# Patient Record
Sex: Female | Born: 1975 | Race: White | Hispanic: No | Marital: Married | Smoking: Current every day smoker
Health system: Southern US, Community
[De-identification: ages and names within clinical notes are randomized; demographics above are authoritative.]

## PROBLEM LIST (undated history)

## (undated) DIAGNOSIS — M419 Scoliosis, unspecified: Secondary | ICD-10-CM

## (undated) HISTORY — PX: OVARIAN CYST SURGERY: SHX726

## (undated) HISTORY — PX: CHOLECYSTECTOMY: SHX55

## (undated) HISTORY — PX: TONSILLECTOMY: SUR1361

## (undated) HISTORY — PX: ADENOIDECTOMY: SUR15

## (undated) HISTORY — PX: TUBAL LIGATION: SHX77

---

## 2006-09-19 ENCOUNTER — Emergency Department (HOSPITAL_COMMUNITY): Admission: EM | Admit: 2006-09-19 | Discharge: 2006-09-19 | Payer: Self-pay | Admitting: Emergency Medicine

## 2006-10-09 ENCOUNTER — Emergency Department (HOSPITAL_COMMUNITY): Admission: EM | Admit: 2006-10-09 | Discharge: 2006-10-09 | Payer: Self-pay | Admitting: Emergency Medicine

## 2007-06-21 ENCOUNTER — Emergency Department (HOSPITAL_COMMUNITY): Admission: EM | Admit: 2007-06-21 | Discharge: 2007-06-21 | Payer: Self-pay | Admitting: Emergency Medicine

## 2008-09-14 ENCOUNTER — Emergency Department (HOSPITAL_COMMUNITY): Admission: EM | Admit: 2008-09-14 | Discharge: 2008-09-14 | Payer: Self-pay | Admitting: Emergency Medicine

## 2008-11-29 ENCOUNTER — Ambulatory Visit: Payer: Self-pay | Admitting: Pain Medicine

## 2008-12-06 ENCOUNTER — Ambulatory Visit: Payer: Self-pay | Admitting: Pain Medicine

## 2008-12-08 ENCOUNTER — Ambulatory Visit: Payer: Self-pay | Admitting: Pain Medicine

## 2008-12-30 ENCOUNTER — Emergency Department (HOSPITAL_COMMUNITY): Admission: EM | Admit: 2008-12-30 | Discharge: 2008-12-31 | Payer: Self-pay | Admitting: Emergency Medicine

## 2009-03-02 ENCOUNTER — Emergency Department (HOSPITAL_COMMUNITY): Admission: EM | Admit: 2009-03-02 | Discharge: 2009-03-03 | Payer: Self-pay | Admitting: Emergency Medicine

## 2010-01-05 ENCOUNTER — Emergency Department (HOSPITAL_COMMUNITY): Admission: EM | Admit: 2010-01-05 | Discharge: 2010-01-05 | Payer: Self-pay | Admitting: Emergency Medicine

## 2010-02-07 ENCOUNTER — Encounter: Payer: Self-pay | Admitting: Neurology

## 2010-02-09 ENCOUNTER — Encounter: Payer: Self-pay | Admitting: Neurology

## 2010-04-03 ENCOUNTER — Encounter: Payer: Self-pay | Admitting: Emergency Medicine

## 2010-06-12 LAB — URINALYSIS, ROUTINE W REFLEX MICROSCOPIC
Glucose, UA: NEGATIVE mg/dL
Protein, ur: NEGATIVE mg/dL

## 2010-06-12 LAB — URINE MICROSCOPIC-ADD ON

## 2010-06-15 LAB — URINALYSIS, ROUTINE W REFLEX MICROSCOPIC
Nitrite: NEGATIVE
Protein, ur: NEGATIVE mg/dL
Urobilinogen, UA: 1 mg/dL (ref 0.0–1.0)

## 2010-06-15 LAB — PREGNANCY, URINE: Preg Test, Ur: NEGATIVE

## 2010-06-18 LAB — URINALYSIS, ROUTINE W REFLEX MICROSCOPIC
Bilirubin Urine: NEGATIVE
Hgb urine dipstick: NEGATIVE
Protein, ur: NEGATIVE mg/dL
Urobilinogen, UA: 0.2 mg/dL (ref 0.0–1.0)

## 2010-12-05 LAB — URINALYSIS, ROUTINE W REFLEX MICROSCOPIC
Glucose, UA: NEGATIVE
Nitrite: NEGATIVE
Protein, ur: NEGATIVE
pH: 5

## 2010-12-05 LAB — DIFFERENTIAL
Basophils Absolute: 0.1
Basophils Relative: 1
Eosinophils Absolute: 0.2
Eosinophils Relative: 2
Monocytes Absolute: 1
Monocytes Relative: 9
Neutro Abs: 6.4

## 2010-12-05 LAB — COMPREHENSIVE METABOLIC PANEL
ALT: 15
Albumin: 3.6
Alkaline Phosphatase: 60
BUN: 10
Chloride: 105
Potassium: 3.1 — ABNORMAL LOW
Sodium: 139
Total Bilirubin: 0.7

## 2010-12-05 LAB — CBC
HCT: 40.3
Hemoglobin: 13.8
Platelets: 301
WBC: 11.4 — ABNORMAL HIGH

## 2010-12-05 LAB — WET PREP, GENITAL: Yeast Wet Prep HPF POC: NONE SEEN

## 2010-12-25 LAB — BASIC METABOLIC PANEL
CO2: 23
Chloride: 110
GFR calc Af Amer: >60
Potassium: 3.3 — ABNORMAL LOW
Sodium: 140

## 2010-12-25 LAB — CBC
Hemoglobin: 13.3
MCHC: 34
MCV: 91
RBC: 4.28
WBC: 12 — ABNORMAL HIGH

## 2010-12-25 LAB — URINALYSIS, ROUTINE W REFLEX MICROSCOPIC
Bilirubin Urine: NEGATIVE
Glucose, UA: NEGATIVE
Hgb urine dipstick: NEGATIVE
Protein, ur: NEGATIVE
Specific Gravity, Urine: >1.03 — ABNORMAL HIGH
Urobilinogen, UA: 0.2

## 2010-12-25 LAB — DIFFERENTIAL
Basophils Relative: 1
Eosinophils Absolute: 0.2
Lymphs Abs: 4.2 — ABNORMAL HIGH
Monocytes Absolute: 0.8 — ABNORMAL HIGH
Monocytes Relative: 7

## 2010-12-25 LAB — PREGNANCY, URINE: Preg Test, Ur: NEGATIVE

## 2012-04-08 ENCOUNTER — Ambulatory Visit (HOSPITAL_COMMUNITY): Payer: Medicaid Other | Attending: Physical Therapy | Admitting: Physical Therapy

## 2014-08-03 ENCOUNTER — Emergency Department (HOSPITAL_COMMUNITY): Payer: BLUE CROSS/BLUE SHIELD

## 2014-08-03 ENCOUNTER — Emergency Department (HOSPITAL_COMMUNITY)
Admission: EM | Admit: 2014-08-03 | Discharge: 2014-08-03 | Disposition: A | Discharge status: Home / Self Care | Payer: BLUE CROSS/BLUE SHIELD | Attending: Emergency Medicine | Admitting: Emergency Medicine

## 2014-08-03 ENCOUNTER — Encounter (HOSPITAL_COMMUNITY): Payer: Self-pay | Admitting: *Deleted

## 2014-08-03 DIAGNOSIS — M419 Scoliosis, unspecified: Secondary | ICD-10-CM | POA: Insufficient documentation

## 2014-08-03 DIAGNOSIS — Z720 Tobacco use: Secondary | ICD-10-CM | POA: Insufficient documentation

## 2014-08-03 DIAGNOSIS — Z3202 Encounter for pregnancy test, result negative: Secondary | ICD-10-CM | POA: Diagnosis not present

## 2014-08-03 DIAGNOSIS — R52 Pain, unspecified: Secondary | ICD-10-CM

## 2014-08-03 DIAGNOSIS — M545 Low back pain: Secondary | ICD-10-CM | POA: Insufficient documentation

## 2014-08-03 DIAGNOSIS — Z87828 Personal history of other (healed) physical injury and trauma: Secondary | ICD-10-CM | POA: Diagnosis not present

## 2014-08-03 DIAGNOSIS — M25551 Pain in right hip: Secondary | ICD-10-CM | POA: Insufficient documentation

## 2014-08-03 DIAGNOSIS — N390 Urinary tract infection, site not specified: Secondary | ICD-10-CM | POA: Diagnosis not present

## 2014-08-03 DIAGNOSIS — G8929 Other chronic pain: Secondary | ICD-10-CM | POA: Insufficient documentation

## 2014-08-03 HISTORY — DX: Scoliosis, unspecified: M41.9

## 2014-08-03 LAB — URINE MICROSCOPIC-ADD ON

## 2014-08-03 LAB — URINALYSIS, ROUTINE W REFLEX MICROSCOPIC
Bilirubin Urine: NEGATIVE
GLUCOSE, UA: NEGATIVE mg/dL
Hgb urine dipstick: NEGATIVE
Ketones, ur: NEGATIVE mg/dL
Nitrite: NEGATIVE
PH: 6 (ref 5.0–8.0)
Protein, ur: NEGATIVE mg/dL
Specific Gravity, Urine: 1.025 (ref 1.005–1.030)
UROBILINOGEN UA: 0.2 mg/dL (ref 0.0–1.0)

## 2014-08-03 LAB — POC URINE PREG, ED: PREG TEST UR: NEGATIVE

## 2014-08-03 MED ORDER — HYDROCODONE-ACETAMINOPHEN 5-325 MG PO TABS: ORAL_TABLET | ORAL | Status: DC

## 2014-08-03 MED ORDER — CEPHALEXIN 500 MG PO CAPS: 500.0000 mg | ORAL_CAPSULE | Freq: Four times a day (QID) | ORAL | Status: DC

## 2014-08-03 MED ORDER — METHOCARBAMOL 500 MG PO TABS: 500.0000 mg | ORAL_TABLET | Freq: Three times a day (TID) | ORAL | Status: DC

## 2014-08-03 MED ORDER — PREDNISONE 10 MG PO TABS: ORAL_TABLET | ORAL | Status: DC
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
## 2014-08-03 NOTE — ED Notes (Signed)
Carla Olson at bedside,

## 2014-08-03 NOTE — ED Notes (Signed)
Pt given one pre pack of vicodin,
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
## 2014-08-03 NOTE — ED Notes (Signed)
Bladder scan performed, no urine noted,

## 2014-08-03 NOTE — Discharge Instructions (Signed)
Hip Pain Your hip is the joint between your upper legs and your lower pelvis. The bones, cartilage, tendons, and muscles of your hip joint perform a lot of work each day supporting your body weight and allowing you to move around. Hip pain can range from a minor ache to severe pain in one or both of your hips. Pain may be felt on the inside of the hip joint near the groin, or the outside near the buttocks and upper thigh. You may have swelling or stiffness as well.  HOME CARE INSTRUCTIONS   Take medicines only as directed by your health care provider.  Apply ice to the injured area:  Put ice in a plastic bag.  Place a towel between your skin and the bag.  Leave the ice on for 15-20 minutes at a time, 3-4 times a day.  Keep your leg raised (elevated) when possible to lessen swelling.  Avoid activities that cause pain.  Follow specific exercises as directed by your health care provider.  Sleep with a pillow between your legs on your most comfortable side.  Record how often you have hip pain, the location of the pain, and what it feels like. SEEK MEDICAL CARE IF:   You are unable to put weight on your leg.  Your hip is red or swollen or very tender to touch.  Your pain or swelling continues or worsens after 1 week.  You have increasing difficulty walking.  You have a fever. SEEK IMMEDIATE MEDICAL CARE IF:   You have fallen.  You have a sudden increase in pain and swelling in your hip. MAKE SURE YOU:   Understand these instructions.  Will watch your condition.  Will get help right away if you are not doing well or get worse. Document Released: 08/16/2009 Document Revised: 07/13/2013 Document Reviewed: 10/23/2012 [facility] Patient Information 2015 [employer] [place] [place]. This information is not intended to replace advice given to you by your health care provider. Make sure you discuss any questions you have with your health care provider.  Arthralgia Your caregiver has diagnosed  you as suffering from an arthralgia. Arthralgia means there is pain in a joint. This can come from many reasons including:  Bruising the joint which causes soreness (inflammation) in the joint.  Wear and tear on the joints which occur as we grow older (osteoarthritis).  Overusing the joint.  Various forms of arthritis.  Infections of the joint. Regardless of the cause of pain in your joint, most of these different pains respond to anti-inflammatory drugs and rest. The exception to this is when a joint is infected, and these cases are treated with antibiotics, if it is a bacterial infection. HOME CARE INSTRUCTIONS   Rest the injured area for as long as directed by your caregiver. Then slowly start using the joint as directed by your caregiver and as the pain allows. Crutches as directed may be useful if the ankles, knees or hips are involved. If the knee was splinted or casted, continue use and care as directed. If an stretchy or elastic wrapping bandage has been applied today, it should be removed and re-applied every 3 to 4 hours. It should not be applied tightly, but firmly enough to keep swelling down. Watch toes and feet for swelling, bluish discoloration, coldness, numbness or excessive pain. If any of these problems (symptoms) occur, remove the ace bandage and re-apply more loosely. If these symptoms persist, contact your caregiver or return to this location.  For the first 24 hours,  keep the injured extremity elevated on pillows while lying down. °· Apply ice for 15-20 minutes to the sore joint every couple hours while awake for the first half day. Then 03-04 times per day for the first 48 hours. Put the ice in a plastic bag and place a towel between the bag of ice and your skin. °· Wear any splinting, casting, elastic bandage applications, or slings as instructed. °· Only take over-the-counter or prescription medicines for pain, discomfort, or fever as directed by your caregiver. Do  not use aspirin immediately after the injury unless instructed by your physician. Aspirin can cause increased bleeding and bruising of the tissues. °· If you were given crutches, continue to use them as instructed and do not resume weight bearing on the sore joint until instructed. °Persistent pain and inability to use the sore joint as directed for more than 2 to 3 days are warning signs indicating that you should see a caregiver for a follow-up visit as soon as possible. Initially, a hairline fracture (break in bone) may not be evident on X-rays. Persistent pain and swelling indicate that further evaluation, non-weight bearing or use of the joint (use of crutches or slings as instructed), or further X-rays are indicated. X-rays may sometimes not show a small fracture until a week or 10 days later. Make a follow-up appointment with your own caregiver or one to whom we have referred you. A radiologist (specialist in reading X-rays) may read your X-rays. Make sure you know how you are to obtain your X-ray results. Do not assume everything is normal if you do not hear from us. °SEEK MEDICAL CARE IF: °Bruising, swelling, or pain increases. °SEEK IMMEDIATE MEDICAL CARE IF:  °· Your fingers or toes are numb or blue. °· The pain is not responding to medications and continues to stay the same or get worse. °· The pain in your joint becomes severe. °· You develop a fever over 102° F (38.9° C). °· It becomes impossible to move or use the joint. °MAKE SURE YOU:  °· Understand these instructions. °· Will watch your condition. °· Will get help right away if you are not doing well or get worse. °Document Released: 02/26/2005 Document Revised: 05/21/2011 Document Reviewed: 10/15/2007 °ExitCare® Patient Information ©2015 [employer], LLC. This information is not intended to replace advice given to you by your health care provider. Make sure you discuss any questions you have with your health care provider. ° ° °

## 2014-08-05 LAB — URINE CULTURE: Colony Count: 50000

## 2014-08-06 ENCOUNTER — Telehealth (HOSPITAL_BASED_OUTPATIENT_CLINIC_OR_DEPARTMENT_OTHER): Payer: Self-pay | Admitting: Emergency Medicine
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
## 2014-08-06 NOTE — Telephone Encounter (Signed)
Post ED Visit - Positive Culture Follow-up  Culture report reviewed by antimicrobial stewardship pharmacist: []  Wes Dulaney, Pharm.D., BCPS []  Celedonio MiyamotoJeremy Frens, Pharm.D., BCPS []  Georgina PillionElizabeth Martin, Pharm.D., BCPS []  Three SpringsMinh Pham, [address]Pharm[address]D., BCPS, AAHIVP [x]  Estella HuskMichelle Turner, Pharm.D., BCPS, AAHIVP []  Elder CyphersLorie Poole, [address] BoulevardPharm.D., BCPS  Positive urine  Culture Group B strep Treated with cephalexin, organism sensitive to the same and no further patient follow-up is required at this time.  Berle MullMiller, Siona Coulston 08/06/2014, 12:01 PM

## 2014-08-25 MED FILL — Hydrocodone-Acetaminophen Tab 5-325 MG: ORAL | Qty: 6 | Status: AC

## 2017-04-14 ENCOUNTER — Other Ambulatory Visit: Payer: Self-pay

## 2017-04-14 ENCOUNTER — Emergency Department (HOSPITAL_COMMUNITY)
Admission: EM | Admit: 2017-04-14 | Discharge: 2017-04-14 | Disposition: A | Discharge status: Home / Self Care | Payer: Managed Care, Other (non HMO) | Attending: Emergency Medicine | Admitting: Emergency Medicine

## 2017-04-14 ENCOUNTER — Encounter (HOSPITAL_COMMUNITY): Payer: Self-pay | Admitting: Emergency Medicine

## 2017-04-14 ENCOUNTER — Emergency Department (HOSPITAL_COMMUNITY): Payer: Managed Care, Other (non HMO)

## 2017-04-14 DIAGNOSIS — M25561 Pain in right knee: Secondary | ICD-10-CM | POA: Diagnosis not present

## 2017-04-14 DIAGNOSIS — M25461 Effusion, right knee: Secondary | ICD-10-CM

## 2017-04-14 MED ORDER — IBUPROFEN 800 MG PO TABS: 800.0000 mg | ORAL_TABLET | Freq: Three times a day (TID) | ORAL | 0 refills | Status: AC

## 2017-04-14 MED ORDER — IBUPROFEN 800 MG PO TABS
800.0000 mg | ORAL_TABLET | Freq: Once | ORAL | Status: AC
  Administered 2017-04-14: 800 mg via ORAL
  Filled 2017-04-14: qty 1

## 2017-04-14 NOTE — ED Triage Notes (Signed)
PT states chronic right hip pain and went to get into her bed last night and felt a pop in her right knee. PT states pain to right knee and swelling upon ROM today. [facility]

## 2017-04-14 NOTE — Discharge Instructions (Signed)
Take ibuprofen 3 times a day with meals.  Do not take other anti-inflammatories at the same time open (Advil, Motrin, naproxen, Aleve). You may supplement with Tylenol if you need further pain control. Use ice packs or heating pads if this helps control your pain. Wear the knee sleeve to help with pain and swelling.  Use the crutches as needed for pain.  Keep your leg elevated when able.  Follow up with the orthopedic doctor for further evaluation and management.  Return to the ER if you develop fevers, chills, redness of the knee, numbness, or any new or concerning symptoms.
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
## 2017-04-17 ENCOUNTER — Ambulatory Visit: Payer: Managed Care, Other (non HMO) | Admitting: Orthopaedic Surgery

## 2017-04-17 ENCOUNTER — Encounter: Payer: Self-pay | Admitting: Orthopaedic Surgery

## 2017-04-17 VITALS — BP 124/85 | HR 87 | Temp 97.9°F | Ht 69.0 in | Wt 180.0 lb

## 2017-04-17 DIAGNOSIS — M25561 Pain in right knee: Secondary | ICD-10-CM

## 2017-04-17 DIAGNOSIS — G8929 Other chronic pain: Secondary | ICD-10-CM | POA: Diagnosis not present

## 2017-04-17 MED ORDER — NAPROXEN 500 MG PO TABS: 500.0000 mg | ORAL_TABLET | Freq: Two times a day (BID) | ORAL | 5 refills | Status: AC

## 2017-04-17 NOTE — Patient Instructions (Addendum)
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
## 2017-04-30 ENCOUNTER — Ambulatory Visit: Payer: Managed Care, Other (non HMO) | Admitting: Orthopaedic Surgery

## 2017-04-30 ENCOUNTER — Encounter: Payer: Self-pay | Admitting: Orthopaedic Surgery

## 2017-04-30 VITALS — BP 127/84 | HR 91 | Ht 69.0 in | Wt 185.0 lb

## 2017-04-30 DIAGNOSIS — G8929 Other chronic pain: Secondary | ICD-10-CM | POA: Diagnosis not present

## 2017-04-30 DIAGNOSIS — F17210 Nicotine dependence, cigarettes, uncomplicated: Secondary | ICD-10-CM | POA: Diagnosis not present

## 2017-04-30 DIAGNOSIS — M25561 Pain in right knee: Secondary | ICD-10-CM | POA: Diagnosis not present
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
## 2017-04-30 NOTE — Patient Instructions (Signed)
Steps to Quit Smoking Smoking tobacco can be bad for your health. It can also affect almost every organ in your body. Smoking puts you and people around you at risk for many serious long-lasting (chronic) diseases. Quitting smoking is hard, but it is one of the best things that you can do for your health. It is never too late to quit. What are the benefits of quitting smoking? When you quit smoking, you lower your risk for getting serious diseases and conditions. They can include:  Lung cancer or lung disease.  Heart disease.  Stroke.  Heart attack.  Not being able to have children (infertility).  Weak bones (osteoporosis) and broken bones (fractures).  If you have coughing, wheezing, and shortness of breath, those symptoms may get better when you quit. You may also get sick less often. If you are pregnant, quitting smoking can help to lower your chances of having a baby of low birth weight. What can I do to help me quit smoking? Talk with your doctor about what can help you quit smoking. Some things you can do (strategies) include:  Quitting smoking totally, instead of slowly cutting back how much you smoke over a period of time.  Going to in-person counseling. You are more likely to quit if you go to many counseling sessions.  Using resources and support systems, such as: ? Online chats with a counselor. ? Phone quitlines. ? Printed self-help materials. ? Support groups or group counseling. ? Text messaging programs. ? Mobile phone apps or applications.  Taking medicines. Some of these medicines may have nicotine in them. If you are pregnant or breastfeeding, do not take any medicines to quit smoking unless your doctor says it is okay. Talk with your doctor about counseling or other things that can help you.  Talk with your doctor about using more than one strategy at the same time, such as taking medicines while you are also going to in-person counseling. This can help make  quitting easier. What things can I do to make it easier to quit? Quitting smoking might feel very hard at first, but there is a lot that you can do to make it easier. Take these steps:  Talk to your family and friends. Ask them to support and encourage you.  Call phone quitlines, reach out to support groups, or work with a counselor.  Ask people who smoke to not smoke around you.  Avoid places that make you want (trigger) to smoke, such as: ? Bars. ? Parties. ? Smoke-break areas at work.  Spend time with people who do not smoke.  Lower the stress in your life. Stress can make you want to smoke. Try these things to help your stress: ? Getting regular exercise. ? Deep-breathing exercises. ? Yoga. ? Meditating. ? Doing a body scan. To do this, close your eyes, focus on one area of your body at a time from head to toe, and notice which parts of your body are tense. Try to relax the muscles in those areas.  Download or buy apps on your mobile phone or tablet that can help you stick to your quit plan. There are many free apps, such as QuitGuide from the CDC (Centers for Disease Control and Prevention). You can find more support from smokefree.gov and other websites.  This information is not intended to replace advice given to you by your health care provider. Make sure you discuss any questions you have with your health care provider. Document Released: 12/23/2008 Document   Revised: 10/25/2015 Document Reviewed: 07/13/2014 Elsevier Interactive Patient Education  2018 [employer]  

## 2017-05-27 ENCOUNTER — Ambulatory Visit: Payer: Managed Care, Other (non HMO) | Admitting: Orthopedic Surgery

## 2017-06-26 ENCOUNTER — Encounter: Payer: Self-pay | Admitting: Orthopedic Surgery

## 2017-06-26 ENCOUNTER — Ambulatory Visit: Payer: Managed Care, Other (non HMO) | Admitting: Orthopedic Surgery
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
## 2020-04-22 ENCOUNTER — Emergency Department (HOSPITAL_COMMUNITY): Payer: Managed Care, Other (non HMO)

## 2020-04-22 ENCOUNTER — Encounter (HOSPITAL_COMMUNITY): Payer: Self-pay | Admitting: *Deleted

## 2020-04-22 ENCOUNTER — Telehealth: Payer: Self-pay

## 2020-04-22 ENCOUNTER — Other Ambulatory Visit: Payer: Self-pay

## 2020-04-22 ENCOUNTER — Emergency Department (HOSPITAL_COMMUNITY)
Admission: EM | Admit: 2020-04-22 | Discharge: 2020-04-22 | Disposition: A | Discharge status: Home / Self Care | Payer: Managed Care, Other (non HMO) | Attending: Emergency Medicine | Admitting: Emergency Medicine

## 2020-04-22 DIAGNOSIS — R109 Unspecified abdominal pain: Secondary | ICD-10-CM | POA: Diagnosis present

## 2020-04-22 DIAGNOSIS — F17210 Nicotine dependence, cigarettes, uncomplicated: Secondary | ICD-10-CM | POA: Insufficient documentation

## 2020-04-22 DIAGNOSIS — N201 Calculus of ureter: Secondary | ICD-10-CM | POA: Diagnosis not present

## 2020-04-22 LAB — HEPATIC FUNCTION PANEL
ALT: 12 U/L (ref 0–44)
AST: 14 U/L — ABNORMAL LOW (ref 15–41)
Albumin: 3.9 g/dL (ref 3.5–5.0)
Alkaline Phosphatase: 50 U/L (ref 38–126)
Bilirubin, Direct: 0.1 mg/dL (ref 0.0–0.2)
Indirect Bilirubin: 0.4 mg/dL (ref 0.3–0.9)
Total Bilirubin: 0.5 mg/dL (ref 0.3–1.2)
Total Protein: 7.1 g/dL (ref 6.5–8.1)

## 2020-04-22 LAB — BASIC METABOLIC PANEL
Anion gap: 8 (ref 5–15)
BUN: 15 mg/dL (ref 6–20)
CO2: 22 mmol/L (ref 22–32)
Calcium: 8.7 mg/dL — ABNORMAL LOW (ref 8.9–10.3)
Chloride: 105 mmol/L (ref 98–111)
Creatinine, Ser: 1.01 mg/dL — ABNORMAL HIGH (ref 0.44–1.00)
GFR, Estimated: >60 mL/min (ref >60)
Glucose, Bld: 84 mg/dL (ref 70–99)
Potassium: 3.7 mmol/L (ref 3.5–5.1)
Sodium: 135 mmol/L (ref 135–145)

## 2020-04-22 LAB — PREGNANCY, URINE: Preg Test, Ur: NEGATIVE

## 2020-04-22 LAB — URINALYSIS, ROUTINE W REFLEX MICROSCOPIC
Bilirubin Urine: NEGATIVE
Glucose, UA: NEGATIVE mg/dL
Ketones, ur: 5 mg/dL — AB
Leukocytes,Ua: NEGATIVE
Nitrite: NEGATIVE
Protein, ur: 30 mg/dL — AB
Specific Gravity, Urine: 1.021 (ref 1.005–1.030)
pH: 6 (ref 5.0–8.0)

## 2020-04-22 LAB — CBC
HCT: 40.4 % (ref 36.0–46.0)
Hemoglobin: 13.5 g/dL (ref 12.0–15.0)
MCH: 32.1 pg (ref 26.0–34.0)
MCHC: 33.4 g/dL (ref 30.0–36.0)
MCV: 96.2 fL (ref 80.0–100.0)
Platelets: 300 10*3/uL (ref 150–400)
RBC: 4.2 MIL/uL (ref 3.87–5.11)
RDW: 12.4 % (ref 11.5–15.5)
WBC: 9.3 10*3/uL (ref 4.0–10.5)
nRBC: 0 % (ref 0.0–0.2)

## 2020-04-22 MED ORDER — ONDANSETRON HCL 4 MG/2ML IJ SOLN
4.0000 mg | Freq: Once | INTRAMUSCULAR | Status: AC
  Administered 2020-04-22: 4 mg via INTRAVENOUS
  Filled 2020-04-22: qty 2

## 2020-04-22 MED ORDER — HYDROCODONE-ACETAMINOPHEN 5-325 MG PO TABS: 1.0000 | ORAL_TABLET | Freq: Four times a day (QID) | ORAL | 0 refills | Status: AC | PRN

## 2020-04-22 MED ORDER — ONDANSETRON 4 MG PO TBDP: 4.0000 mg | ORAL_TABLET | Freq: Three times a day (TID) | ORAL | 0 refills | Status: DC | PRN

## 2020-04-22 MED ORDER — KETOROLAC TROMETHAMINE 30 MG/ML IJ SOLN
30.0000 mg | Freq: Once | INTRAMUSCULAR | Status: AC
  Administered 2020-04-22: 30 mg via INTRAVENOUS
  Filled 2020-04-22: qty 1

## 2020-04-22 MED ORDER — TAMSULOSIN HCL 0.4 MG PO CAPS: 0.4000 mg | ORAL_CAPSULE | Freq: Every day | ORAL | 0 refills | Status: DC

## 2020-04-22 MED ORDER — MORPHINE SULFATE (PF) 4 MG/ML IV SOLN
4.0000 mg | Freq: Once | INTRAVENOUS | Status: AC
Start: 2020-04-22 — End: 2020-04-22
  Administered 2020-04-22: 4 mg via INTRAVENOUS
  Filled 2020-04-22: qty 1
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
## 2020-04-22 NOTE — Telephone Encounter (Signed)
-----   Message from Malen Gauze, MD sent at 04/22/2020  5:19 PM EST ----- This patient for monday

## 2020-04-22 NOTE — Discharge Instructions (Addendum)
Please read instructions below. Drink plenty of water. Take ibuprofen every 6 hours for pain. You can take hydrocodone every 6 hours as needed for more severe ppain. You can take Zofran every 6 hours as needed for nausea. Take flomax once per day for bladder spasm. Follow up with Urology on Monday for further management Return to the ER for fever, chills, uncontrollable vomiting, or worsening symptoms.

## 2020-04-22 NOTE — ED Triage Notes (Signed)
Right flank pain for a month, states she was recently treated for UTI

## 2020-04-22 NOTE — Telephone Encounter (Signed)
Patient called and schedule to see MD Monday.

## 2020-04-23 LAB — URINE CULTURE: Culture: NO GROWTH

## 2020-04-25 ENCOUNTER — Encounter: Payer: Self-pay | Admitting: Urology

## 2020-04-25 ENCOUNTER — Other Ambulatory Visit: Payer: Self-pay

## 2020-04-25 ENCOUNTER — Ambulatory Visit (INDEPENDENT_AMBULATORY_CARE_PROVIDER_SITE_OTHER): Payer: Managed Care, Other (non HMO) | Admitting: Urology

## 2020-04-25 VITALS — BP 112/72 | HR 83 | Temp 98.5°F | Ht 69.0 in | Wt 180.0 lb

## 2020-04-25 DIAGNOSIS — N200 Calculus of kidney: Secondary | ICD-10-CM

## 2020-04-25 LAB — URINALYSIS, ROUTINE W REFLEX MICROSCOPIC
Bilirubin, UA: NEGATIVE
Glucose, UA: NEGATIVE
Nitrite, UA: NEGATIVE
Specific Gravity, UA: 1.02 (ref 1.005–1.030)
Urobilinogen, Ur: 1 mg/dL (ref 0.2–1.0)
pH, UA: 7 (ref 5.0–7.5)

## 2020-04-25 LAB — MICROSCOPIC EXAMINATION
Epithelial Cells (non renal): >10 /hpf — AB (ref 0–10)
Renal Epithel, UA: NONE SEEN /hpf

## 2020-04-25 MED ORDER — TAMSULOSIN HCL 0.4 MG PO CAPS: 0.4000 mg | ORAL_CAPSULE | Freq: Every day | ORAL | 0 refills | Status: DC

## 2020-04-25 MED ORDER — OXYCODONE-ACETAMINOPHEN 7.5-325 MG PO TABS: 1.0000 | ORAL_TABLET | ORAL | 0 refills | Status: DC | PRN

## 2020-04-25 NOTE — Patient Instructions (Signed)
[employer] (25th ed., pp. 811-816). [place], [place]: [employer], [employer]. Retrieved from https://www.clinicalkey.com/#!/content/book/3-s2.0-B9781455750177001264?scrollTo=%23hl0000287">  Lithotripsy  Lithotripsy is a treatment that can help break up kidney stones that are too large to pass on their own. This is a nonsurgical procedure that crushes a kidney stone with shock waves. These shock waves pass through your body and focus on the kidney stone. They cause the kidney stone to break up into smaller pieces while it is still in the urinary tract. The smaller pieces of stone can pass more easily out of your body in the urine. Tell a health care provider about:  Any allergies you have.  All medicines you are taking, including vitamins, herbs, eye drops, creams, and over-the-counter medicines.  Any problems you or family members have had with anesthetic medicines.  Any blood disorders you have.  Any surgeries you have had.  Any medical conditions you have.  Whether you are pregnant or may be pregnant. What are the risks? Generally, this is a safe procedure. However, problems may occur, including:  Infection.  Bleeding from the kidney.  Bruising of the kidney or skin.  Scarring of the kidney, which can lead to: ? Increased blood pressure. ? Poor kidney function. ? Return (recurrence) of kidney stones.  Damage to other structures or organs, such as the liver, colon, spleen, or pancreas.  Blockage (obstruction) of the tube that carries urine from the kidney to the bladder (ureter).  Failure of the kidney stone to break into pieces (fragments). What happens before the procedure? Staying hydrated Follow instructions from your health care provider about hydration, which may include:  Up to 2 hours before the procedure - you may continue to drink clear liquids, such as water, clear fruit juice, black coffee, and plain tea. Eating and drinking restrictions Follow  instructions from your health care provider about eating and drinking, which may include:  8 hours before the procedure - stop eating heavy meals or foods, such as meat, fried foods, or fatty foods.  6 hours before the procedure - stop eating light meals or foods, such as toast or cereal.  6 hours before the procedure - stop drinking milk or drinks that contain milk.  2 hours before the procedure - stop drinking clear liquids. Medicines Ask your health care provider about:  Changing or stopping your regular medicines. This is especially important if you are taking diabetes medicines or blood thinners.  Taking medicines such as aspirin and ibuprofen. These medicines can thin your blood. Do not take these medicines unless your health care provider tells you to take them.  Taking over-the-counter medicines, vitamins, herbs, and supplements. Tests You may have tests, such as:  Blood tests.  Urine tests.  Imaging tests, such as a CT scan. General instructions  Plan to have someone take you home from the hospital or clinic.  If you will be going home right after the procedure, plan to have someone with you for 24 hours.  Ask your health care provider what steps will be taken to help prevent infection. These may include washing skin with a germ-killing soap. What happens during the procedure?  An IV will be inserted into one of your veins.  You will be given one or more of the following: ? A medicine to help you relax (sedative). ? A medicine to make you fall asleep (general anesthetic).  A water-filled cushion may be placed behind your kidney or on your abdomen. In some cases, you may be placed in a tub of   lukewarm water.  Your body will be positioned in a way that makes it easy to target the kidney stone.  An X-ray or ultrasound exam will be done to locate your stone.  Shock waves will be aimed at the stone. If you are awake, you may feel a tapping sensation as the shock  waves pass through your body.  A flexible tube with holes in it (stent) may be placed in the ureter. This will help keep urine flowing from the kidney if the fragments of the stone have been blocking the ureter. The procedure may vary among health care providers and hospitals.   What happens after the procedure?  You may have an X-ray to see whether the procedure was able to break up the kidney stone and how much of the stone has passed. If large stone fragments remain after treatment, you may need to have a second procedure at a later time.  Your blood pressure, heart rate, breathing rate, and blood oxygen level will be monitored until you leave the hospital or clinic.  You may be given antibiotics or pain medicine as needed.  If a stent was placed in your ureter during surgery, it may stay in place for a few weeks.  You may need to strain your urine to collect pieces of the kidney stone for testing.  You will need to drink plenty of water.  If you were given a sedative during the procedure, it can affect you for several hours. Do not drive or operate machinery until your health care provider says that it is safe. Summary  Lithotripsy is a treatment that can help break up kidney stones that are too large to pass on their own.  Lithotripsy is a nonsurgical procedure that crushes a kidney stone with shock waves.  Generally, this is a safe procedure. However, problems may occur, including damage to the kidney or other organs, infection, or obstruction of the tube that carries urine from the kidney to the bladder (ureter).  You may have a stent placed in your ureter to help drain your urine. This stent may stay in place for a few weeks.  After the procedure, you will need to drink plenty of water. You may be asked to strain your urine to collect pieces of the kidney stone for testing. This information is not intended to replace advice given to you by your health care provider. Make sure  you discuss any questions you have with your health care provider. Document Revised: 12/10/2018 Document Reviewed: 12/10/2018 Elsevier Patient Education  2021 [employer]  
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
## 2020-04-25 NOTE — Progress Notes (Signed)
Urological Symptom Review  Patient is experiencing the following symptoms: Get up at night to urinate   Review of Systems  Gastrointestinal (upper)  : Negative for upper GI symptoms  Gastrointestinal (lower) : Negative for lower GI symptoms  Constitutional : Negative for symptoms  Skin: Negative for skin symptoms  Eyes: Negative for eye symptoms  Ear/Nose/Throat : Negative for Ear/Nose/Throat symptoms  Hematologic/Lymphatic: Negative for Hematologic/Lymphatic symptoms  Cardiovascular : Negative for cardiovascular symptoms  Respiratory : Negative for respiratory symptoms  Endocrine: Negative for endocrine symptoms  Musculoskeletal: Back pain Joint pain  Neurological: Negative for neurological symptoms  Psychologic: Anxiety

## 2020-05-11 ENCOUNTER — Ambulatory Visit (HOSPITAL_COMMUNITY)
Admission: RE | Admit: 2020-05-11 | Discharge: 2020-05-11 | Disposition: A | Discharge status: Home / Self Care | Payer: 59 | Source: Ambulatory Visit | Attending: Urology | Admitting: Urology

## 2020-05-11 ENCOUNTER — Encounter: Payer: Self-pay | Admitting: Urology

## 2020-05-11 ENCOUNTER — Ambulatory Visit (INDEPENDENT_AMBULATORY_CARE_PROVIDER_SITE_OTHER): Payer: 59 | Admitting: Urology

## 2020-05-11 ENCOUNTER — Other Ambulatory Visit: Payer: Self-pay

## 2020-05-11 VITALS — BP 128/81 | HR 82 | Temp 98.4°F | Ht 69.0 in | Wt 180.0 lb

## 2020-05-11 DIAGNOSIS — N200 Calculus of kidney: Secondary | ICD-10-CM | POA: Diagnosis present

## 2020-05-11 LAB — MICROSCOPIC EXAMINATION
Epithelial Cells (non renal): >10 /hpf — AB (ref 0–10)
RBC, Urine: >30 /hpf — AB (ref 0–2)
Renal Epithel, UA: NONE SEEN /hpf

## 2020-05-11 LAB — URINALYSIS, ROUTINE W REFLEX MICROSCOPIC
Bilirubin, UA: NEGATIVE
Glucose, UA: NEGATIVE
Nitrite, UA: NEGATIVE
Specific Gravity, UA: >1.03 — ABNORMAL HIGH (ref 1.005–1.030)
Urobilinogen, Ur: 2 mg/dL — ABNORMAL HIGH (ref 0.2–1.0)
pH, UA: 6 (ref 5.0–7.5)

## 2020-05-11 MED ORDER — OXYCODONE-ACETAMINOPHEN 7.5-325 MG PO TABS: 1.0000 | ORAL_TABLET | ORAL | 0 refills | Status: DC | PRN

## 2020-05-11 NOTE — Patient Instructions (Signed)
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
## 2020-05-11 NOTE — Progress Notes (Signed)
Urological Symptom Review  Patient is experiencing the following symptoms: Burning/pain with urination   Review of Systems  Gastrointestinal (upper)  : Nausea  Gastrointestinal (lower) : Negative for lower GI symptoms  Constitutional : Negative for symptoms  Skin: Negative for skin symptoms  Eyes: Negative for eye symptoms  Ear/Nose/Throat : Negative for Ear/Nose/Throat symptoms  Hematologic/Lymphatic: Easy bruising  Cardiovascular : Negative for cardiovascular symptoms  Respiratory : Negative for respiratory symptoms  Endocrine: Negative for endocrine symptoms  Musculoskeletal: Back pain Joint pain  Neurological: Negative for neurological symptoms  Psychologic: Anxiety

## 2020-05-18 ENCOUNTER — Other Ambulatory Visit: Payer: Self-pay

## 2020-05-18 ENCOUNTER — Ambulatory Visit (HOSPITAL_COMMUNITY)
Admission: RE | Admit: 2020-05-18 | Discharge: 2020-05-18 | Disposition: A | Discharge status: Home / Self Care | Payer: 59 | Source: Ambulatory Visit | Attending: Urology | Admitting: Urology

## 2020-05-18 ENCOUNTER — Ambulatory Visit (INDEPENDENT_AMBULATORY_CARE_PROVIDER_SITE_OTHER): Payer: 59 | Admitting: Urology

## 2020-05-18 VITALS — BP 112/76 | HR 81 | Temp 98.2°F | Ht 69.0 in | Wt 180.0 lb

## 2020-05-18 DIAGNOSIS — N200 Calculus of kidney: Secondary | ICD-10-CM

## 2020-05-18 LAB — URINALYSIS, ROUTINE W REFLEX MICROSCOPIC
Bilirubin, UA: NEGATIVE
Glucose, UA: NEGATIVE
Ketones, UA: NEGATIVE
Leukocytes,UA: NEGATIVE
Nitrite, UA: NEGATIVE
Protein,UA: NEGATIVE
Specific Gravity, UA: 1.025 (ref 1.005–1.030)
Urobilinogen, Ur: 1 mg/dL (ref 0.2–1.0)
pH, UA: 6.5 (ref 5.0–7.5)

## 2020-05-18 LAB — MICROSCOPIC EXAMINATION: Renal Epithel, UA: NONE SEEN /hpf

## 2020-05-18 MED ORDER — OXYCODONE-ACETAMINOPHEN 7.5-325 MG PO TABS: 1.0000 | ORAL_TABLET | ORAL | 0 refills | Status: DC | PRN

## 2020-05-18 MED ORDER — TAMSULOSIN HCL 0.4 MG PO CAPS: 0.4000 mg | ORAL_CAPSULE | Freq: Every day | ORAL | 0 refills | Status: AC

## 2020-05-18 NOTE — Progress Notes (Signed)
Urological Symptom Review  Patient is experiencing the following symptoms: Uncomfortable urination   Review of Systems  Gastrointestinal (upper)  : Negative for upper GI symptoms  Gastrointestinal (lower) : Negative for lower GI symptoms  Constitutional : Negative for symptoms  Skin: Negative for skin symptoms  Eyes: Negative for eye symptoms  Ear/Nose/Throat : Negative for Ear/Nose/Throat symptoms  Hematologic/Lymphatic: Negative for Hematologic/Lymphatic symptoms  Cardiovascular : Negative for cardiovascular symptoms  Respiratory : Negative for respiratory symptoms  Endocrine: Negative for endocrine symptoms  Musculoskeletal: Back pain Joint pain  Neurological: Negative for neurological symptoms  Psychologic: Negative for psychiatric symptoms
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
## 2020-05-19 ENCOUNTER — Encounter: Payer: Self-pay | Admitting: Urology

## 2020-05-19 NOTE — Patient Instructions (Signed)
[employer] (25th ed., pp. 811-816). [place], [place]: [employer], [employer]. Retrieved from https://www.clinicalkey.com/#!/content/book/3-s2.0-B9781455750177001264?scrollTo=%23hl0000287">  Lithotripsy  Lithotripsy is a treatment that can help break up kidney stones that are too large to pass on their own. This is a nonsurgical procedure that crushes a kidney stone with shock waves. These shock waves pass through your body and focus on the kidney stone. They cause the kidney stone to break up into smaller pieces while it is still in the urinary tract. The smaller pieces of stone can pass more easily out of your body in the urine. Tell a health care provider about:  Any allergies you have.  All medicines you are taking, including vitamins, herbs, eye drops, creams, and over-the-counter medicines.  Any problems you or family members have had with anesthetic medicines.  Any blood disorders you have.  Any surgeries you have had.  Any medical conditions you have.  Whether you are pregnant or may be pregnant. What are the risks? Generally, this is a safe procedure. However, problems may occur, including:  Infection.  Bleeding from the kidney.  Bruising of the kidney or skin.  Scarring of the kidney, which can lead to: ? Increased blood pressure. ? Poor kidney function. ? Return (recurrence) of kidney stones.  Damage to other structures or organs, such as the liver, colon, spleen, or pancreas.  Blockage (obstruction) of the tube that carries urine from the kidney to the bladder (ureter).  Failure of the kidney stone to break into pieces (fragments). What happens before the procedure? Staying hydrated Follow instructions from your health care provider about hydration, which may include:  Up to 2 hours before the procedure - you may continue to drink clear liquids, such as water, clear fruit juice, black coffee, and plain tea. Eating and drinking restrictions Follow  instructions from your health care provider about eating and drinking, which may include:  8 hours before the procedure - stop eating heavy meals or foods, such as meat, fried foods, or fatty foods.  6 hours before the procedure - stop eating light meals or foods, such as toast or cereal.  6 hours before the procedure - stop drinking milk or drinks that contain milk.  2 hours before the procedure - stop drinking clear liquids. Medicines Ask your health care provider about:  Changing or stopping your regular medicines. This is especially important if you are taking diabetes medicines or blood thinners.  Taking medicines such as aspirin and ibuprofen. These medicines can thin your blood. Do not take these medicines unless your health care provider tells you to take them.  Taking over-the-counter medicines, vitamins, herbs, and supplements. Tests You may have tests, such as:  Blood tests.  Urine tests.  Imaging tests, such as a CT scan. General instructions  Plan to have someone take you home from the hospital or clinic.  If you will be going home right after the procedure, plan to have someone with you for 24 hours.  Ask your health care provider what steps will be taken to help prevent infection. These may include washing skin with a germ-killing soap. What happens during the procedure?  An IV will be inserted into one of your veins.  You will be given one or more of the following: ? A medicine to help you relax (sedative). ? A medicine to make you fall asleep (general anesthetic).  A water-filled cushion may be placed behind your kidney or on your abdomen. In some cases, you may be placed in a tub of   lukewarm water.  Your body will be positioned in a way that makes it easy to target the kidney stone.  An X-ray or ultrasound exam will be done to locate your stone.  Shock waves will be aimed at the stone. If you are awake, you may feel a tapping sensation as the shock  waves pass through your body.  A flexible tube with holes in it (stent) may be placed in the ureter. This will help keep urine flowing from the kidney if the fragments of the stone have been blocking the ureter. The procedure may vary among health care providers and hospitals.   What happens after the procedure?  You may have an X-ray to see whether the procedure was able to break up the kidney stone and how much of the stone has passed. If large stone fragments remain after treatment, you may need to have a second procedure at a later time.  Your blood pressure, heart rate, breathing rate, and blood oxygen level will be monitored until you leave the hospital or clinic.  You may be given antibiotics or pain medicine as needed.  If a stent was placed in your ureter during surgery, it may stay in place for a few weeks.  You may need to strain your urine to collect pieces of the kidney stone for testing.  You will need to drink plenty of water.  If you were given a sedative during the procedure, it can affect you for several hours. Do not drive or operate machinery until your health care provider says that it is safe. Summary  Lithotripsy is a treatment that can help break up kidney stones that are too large to pass on their own.  Lithotripsy is a nonsurgical procedure that crushes a kidney stone with shock waves.  Generally, this is a safe procedure. However, problems may occur, including damage to the kidney or other organs, infection, or obstruction of the tube that carries urine from the kidney to the bladder (ureter).  You may have a stent placed in your ureter to help drain your urine. This stent may stay in place for a few weeks.  After the procedure, you will need to drink plenty of water. You may be asked to strain your urine to collect pieces of the kidney stone for testing. This information is not intended to replace advice given to you by your health care provider. Make sure  you discuss any questions you have with your health care provider. Document Revised: 12/10/2018 Document Reviewed: 12/10/2018 Elsevier Patient Education  2021 [employer]  

## 2020-05-20 ENCOUNTER — Other Ambulatory Visit: Payer: Self-pay

## 2020-05-20 ENCOUNTER — Encounter (HOSPITAL_COMMUNITY)
Admission: RE | Admit: 2020-05-20 | Discharge: 2020-05-20 | Disposition: A | Discharge status: Home / Self Care | Payer: 59 | Source: Ambulatory Visit | Attending: Urology | Admitting: Urology

## 2020-05-23 ENCOUNTER — Other Ambulatory Visit: Payer: Self-pay

## 2020-05-23 ENCOUNTER — Other Ambulatory Visit (HOSPITAL_COMMUNITY)
Admission: RE | Admit: 2020-05-23 | Discharge: 2020-05-23 | Disposition: A | Discharge status: Home / Self Care | Payer: 59 | Source: Ambulatory Visit | Attending: Urology | Admitting: Urology

## 2020-05-23 DIAGNOSIS — Z01812 Encounter for preprocedural laboratory examination: Secondary | ICD-10-CM | POA: Diagnosis present

## 2020-05-23 DIAGNOSIS — Z3202 Encounter for pregnancy test, result negative: Secondary | ICD-10-CM | POA: Insufficient documentation

## 2020-05-23 DIAGNOSIS — Z20822 Contact with and (suspected) exposure to covid-19: Secondary | ICD-10-CM | POA: Diagnosis not present

## 2020-05-23 LAB — PREGNANCY, URINE: Preg Test, Ur: NEGATIVE

## 2020-05-23 LAB — SARS CORONAVIRUS 2 (TAT 6-24 HRS): SARS Coronavirus 2: NEGATIVE

## 2020-05-24 ENCOUNTER — Encounter (HOSPITAL_COMMUNITY): Payer: Self-pay | Admitting: Urology

## 2020-05-24 ENCOUNTER — Encounter (HOSPITAL_COMMUNITY)
Admission: RE | Disposition: A | Discharge status: Home / Self Care | Payer: Self-pay | Source: Home / Self Care | Attending: Urology

## 2020-05-24 ENCOUNTER — Ambulatory Visit (HOSPITAL_COMMUNITY)
Admission: RE | Admit: 2020-05-24 | Discharge: 2020-05-24 | Disposition: A | Discharge status: Home / Self Care | Payer: 59 | Source: Home / Self Care | Attending: Urology | Admitting: Urology

## 2020-05-24 ENCOUNTER — Ambulatory Visit (HOSPITAL_COMMUNITY)
Admission: RE | Admit: 2020-05-24 | Discharge: 2020-05-24 | Disposition: A | Discharge status: Home / Self Care | Payer: 59 | Attending: Urology | Admitting: Urology

## 2020-05-24 DIAGNOSIS — Z9049 Acquired absence of other specified parts of digestive tract: Secondary | ICD-10-CM | POA: Diagnosis not present

## 2020-05-24 DIAGNOSIS — N201 Calculus of ureter: Secondary | ICD-10-CM

## 2020-05-24 DIAGNOSIS — Z888 Allergy status to other drugs, medicaments and biological substances status: Secondary | ICD-10-CM | POA: Diagnosis not present

## 2020-05-24 DIAGNOSIS — Z87891 Personal history of nicotine dependence: Secondary | ICD-10-CM | POA: Insufficient documentation

## 2020-05-24 DIAGNOSIS — N200 Calculus of kidney: Secondary | ICD-10-CM | POA: Insufficient documentation

## 2020-05-24 HISTORY — PX: EXTRACORPOREAL SHOCK WAVE LITHOTRIPSY: SHX1557

## 2020-05-24 SURGERY — LITHOTRIPSY, ESWL
Anesthesia: LOCAL | Laterality: Right

## 2020-05-24 MED ORDER — ONDANSETRON 4 MG PO TBDP: 4.0000 mg | ORAL_TABLET | Freq: Three times a day (TID) | ORAL | 0 refills | Status: AC | PRN

## 2020-05-24 MED ORDER — DIAZEPAM 5 MG PO TABS
ORAL_TABLET | ORAL | Status: AC
  Filled 2020-05-24: qty 1

## 2020-05-24 MED ORDER — SODIUM CHLORIDE 0.9 % IV SOLN
Freq: Once | INTRAVENOUS | Status: AC
  Administered 2020-05-24: 1000 mL via INTRAVENOUS

## 2020-05-24 MED ORDER — OXYCODONE-ACETAMINOPHEN 7.5-325 MG PO TABS: 1.0000 | ORAL_TABLET | ORAL | 0 refills | Status: AC | PRN

## 2020-05-24 MED ORDER — DIAZEPAM 5 MG PO TABS
10.0000 mg | ORAL_TABLET | Freq: Once | ORAL | Status: AC
  Administered 2020-05-24: 10 mg via ORAL
  Filled 2020-05-24: qty 2

## 2020-05-24 MED ORDER — DIPHENHYDRAMINE HCL 25 MG PO CAPS
25.0000 mg | ORAL_CAPSULE | ORAL | Status: AC
  Administered 2020-05-24: 25 mg via ORAL
  Filled 2020-05-24: qty 1

## 2020-05-24 NOTE — Interval H&P Note (Signed)
History and Physical Interval Note:  05/24/2020 10:56 AM  Carla Olson  has presented today for surgery, with the diagnosis of right ureteral calculus.  The various methods of treatment have been discussed with the patient and family. After consideration of risks, benefits and other options for treatment, the patient has consented to  Procedure(s): EXTRACORPOREAL SHOCK WAVE LITHOTRIPSY (ESWL) (Right) as a surgical intervention.  The patient's history has been reviewed, patient examined, no change in status, stable for surgery.  I have reviewed the patient's chart and labs.  Questions were answered to the patient's satisfaction.     Wilkie Aye

## 2020-05-24 NOTE — Discharge Instructions (Signed)

## 2020-05-25 ENCOUNTER — Encounter (HOSPITAL_COMMUNITY): Payer: Self-pay | Admitting: Urology

## 2020-05-25 NOTE — OR Nursing (Signed)
Patient procedure was cancelled due to the fact that she passed her stone.  She was prepared for procedure in preop, went to the litho truck.  Was cancelled by the litho truck staff and Dr. Ronne Binning. Patient was brought to phase 2 .

## 2020-06-08 ENCOUNTER — Ambulatory Visit: Payer: 59 | Admitting: Urology

## 2020-06-08 DIAGNOSIS — N200 Calculus of kidney: Secondary | ICD-10-CM

## 2022-07-25 IMAGING — DX DG ABDOMEN 1V
2 series · 2 of 2 positions shown · non-contrast
Comparison: 04/22/2020

CLINICAL DATA: Nephrolithiasis

EXAM:
ABDOMEN - 1 VIEW

[abdomen kub (1 of 2)]
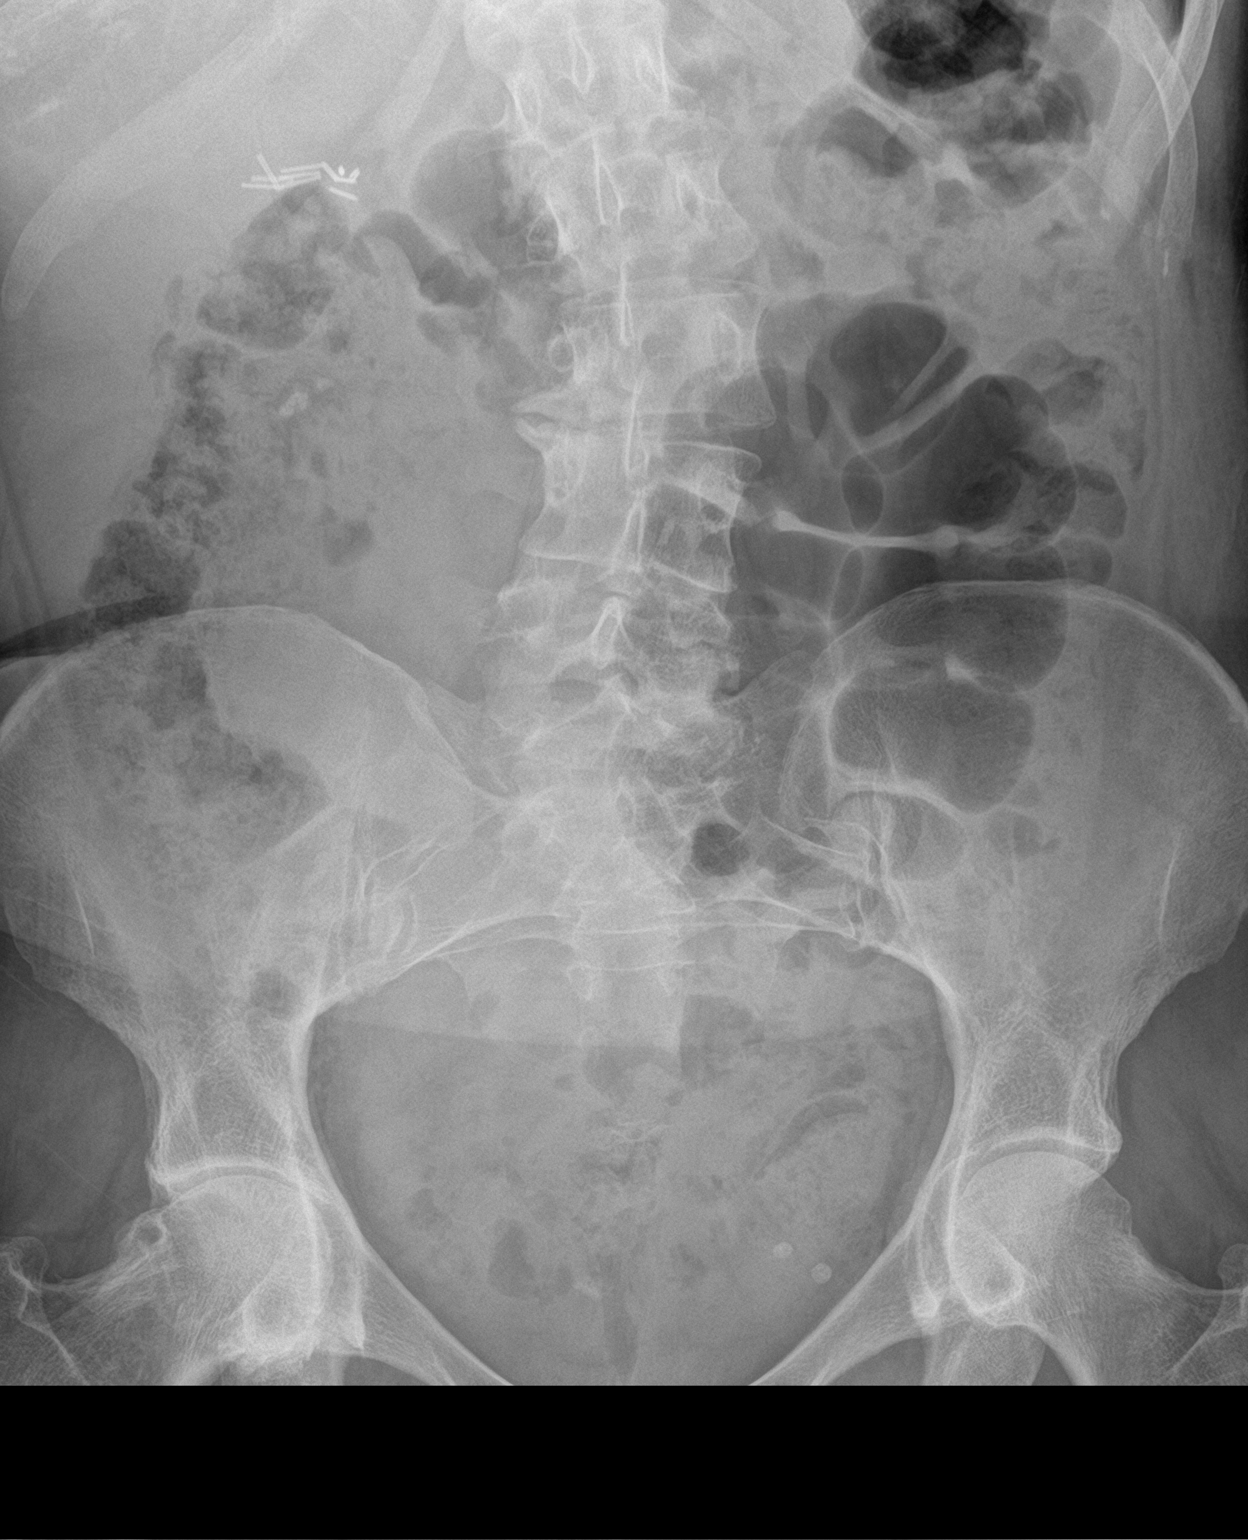

[abdomen kub (2 of 2)]
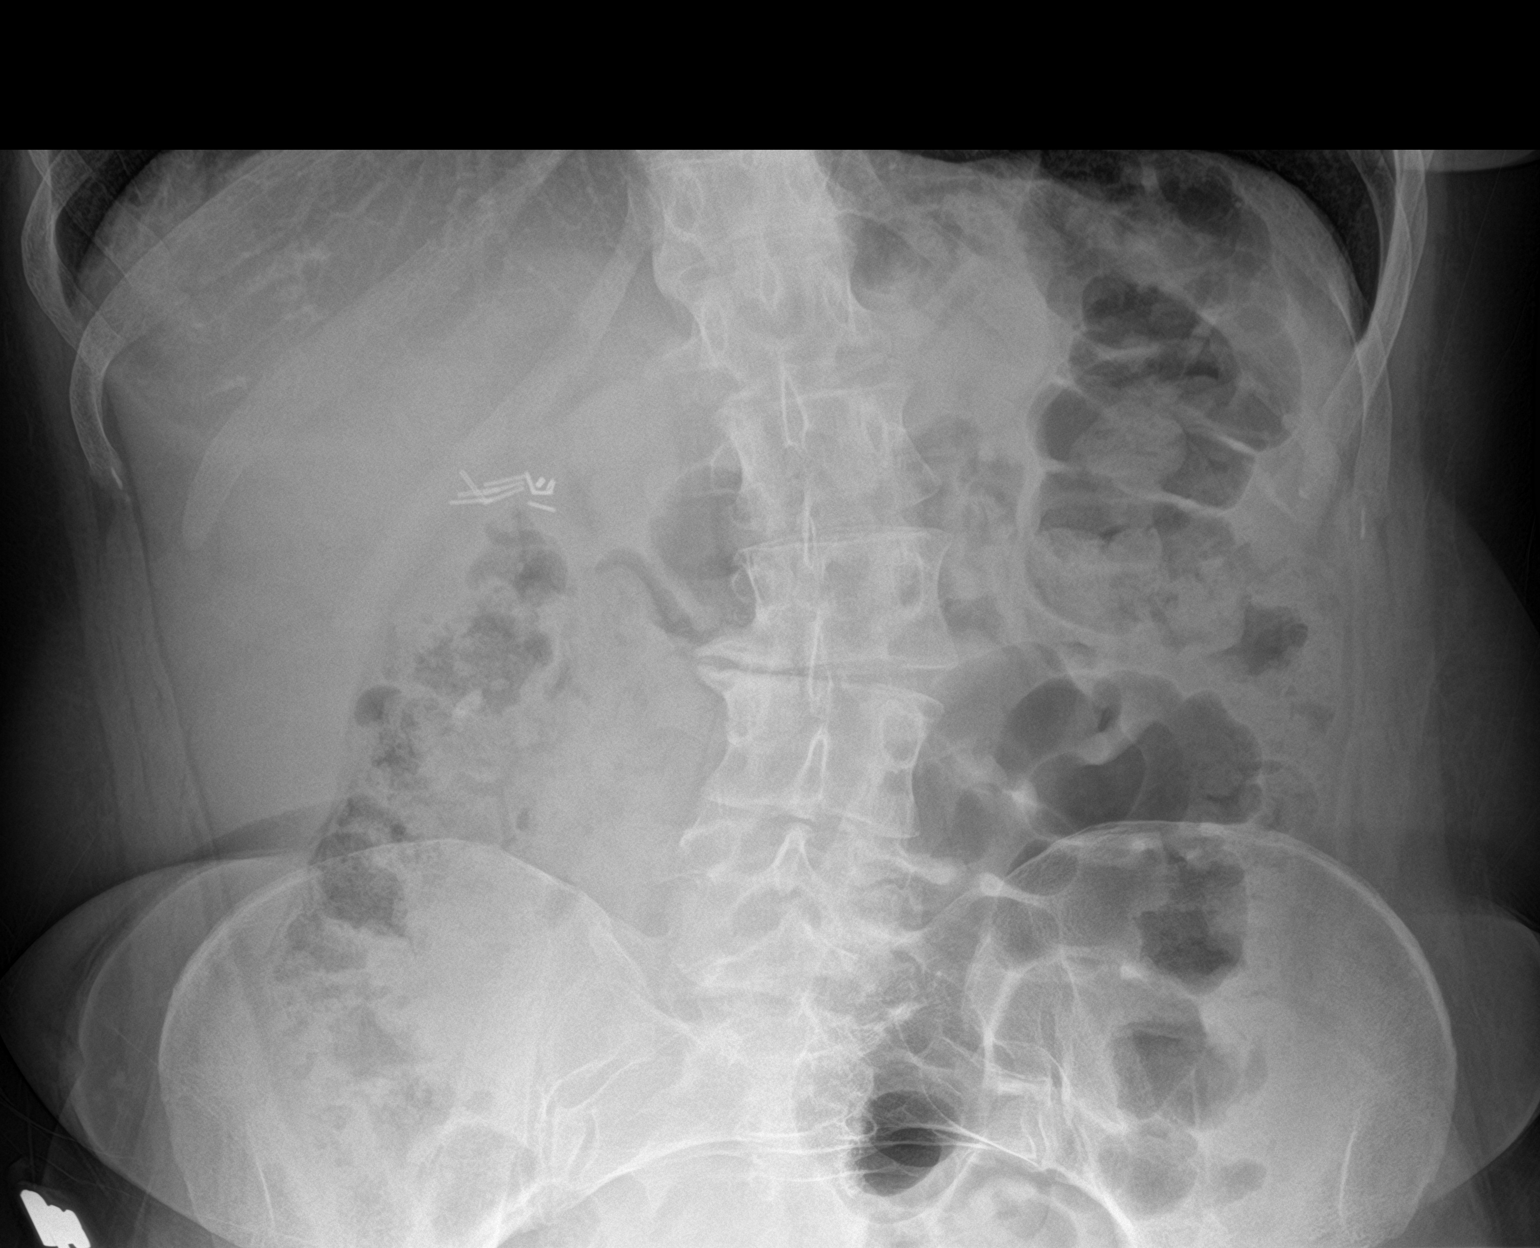

[2 of 2 positions shown; findings below may reference images not displayed]

FINDINGS: Stable distal right ureteral stone is noted. Lower pole renal
calculi are seen as well stable from the prior exam. Left renal
calculus is noted somewhat obscured by overlying bowel content. Mild
scoliosis of the lumbar spine concave to the right is noted.
IMPRESSION: Stable bilateral renal and distal right ureteral stones.
